# Patient Record
Sex: Male | Born: 1985 | Race: Black or African American | Hispanic: No | Marital: Single | Smoking: Current every day smoker
Health system: Southern US, Community
[De-identification: ages and names within clinical notes are randomized; demographics above are authoritative.]

## PROBLEM LIST (undated history)

## (undated) DIAGNOSIS — F319 Bipolar disorder, unspecified: Secondary | ICD-10-CM

---

## 2016-07-07 ENCOUNTER — Emergency Department (HOSPITAL_COMMUNITY)
Admission: EM | Admit: 2016-07-07 | Discharge: 2016-07-07 | Disposition: A | Discharge status: Home / Self Care | Payer: Medicare Other | Attending: Emergency Medicine | Admitting: Emergency Medicine

## 2016-07-07 ENCOUNTER — Encounter (HOSPITAL_COMMUNITY): Payer: Self-pay | Admitting: *Deleted

## 2016-07-07 DIAGNOSIS — K921 Melena: Secondary | ICD-10-CM | POA: Insufficient documentation

## 2016-07-07 DIAGNOSIS — R1033 Periumbilical pain: Secondary | ICD-10-CM | POA: Insufficient documentation

## 2016-07-07 DIAGNOSIS — F17210 Nicotine dependence, cigarettes, uncomplicated: Secondary | ICD-10-CM | POA: Insufficient documentation

## 2016-07-07 LAB — POC OCCULT BLOOD, ED: FECAL OCCULT BLD: NEGATIVE

## 2016-07-07 NOTE — ED Triage Notes (Signed)
Patient states yesterday he noticed mild abdominal pain in periumbilical area.  This morning, patient noticed "a knot" above his umbilicus.  Patient denies N/V/D and fever.  Patient has never been dx with a hernia and denies hx of abdominal surgeries.

## 2016-07-07 NOTE — Discharge Instructions (Signed)
Read the information below.   Your hemoccult was negative for blood. I suspect the mass in your abdomen may be a hernia or lymph node. You can take tylenol or motrin for pain relief. You can take a stool softner such as dulcolax or colace. Try to avoid straining or heavy lifting.  If symptoms do not improve, the contact information for surgery is provided. Please call to schedule a follow up.  You may return to the Emergency Department at any time for worsening condition or any new symptoms that concern you. Return to ED if you develop fever, nausea/vomiting, blood in stool, or inability to keep food down.

## 2016-07-07 NOTE — ED Provider Notes (Signed)
[facility]-EMERGENCY DEPT Provider Note   CSN: [id number] Arrival date & time: 07/07/16  1135     History   Chief Complaint Chief Complaint  Patient presents with  . Abdominal Pain    HPI Russell Boyle is a 30 y.o. male.  Russell Boyle is a 30 y.o. male with no pertinent medical history presents to ED with complaint of periumbilical pain and a "knot" in abdomen. Patient states he started experiencing periumbilical pain yesterday after drinking a soda. Pain is intermittent, 6-7/10, non-radiating, cramping in nature. Pain is worse with movement. Nothing seems to make the pain better. This morning he noticed a "knot" above his umbilicus. It is mildly tender with manipulation. He denies nausea, vomiting, diarrhea, or constipation. He does state that he has had intermittent "blood clots" in stool and bright red blood on tissue over the last 4-6 months. He denies fever, dysuria, hematuria, chest pain, shortness of breath. No history of hernia. No h/o abdominal conditions. No abdominal surgeries.       History reviewed. No pertinent past medical history.  There are no active problems to display for this patient.   History reviewed. No pertinent surgical history.     Home Medications    Prior to Admission medications   Not on File    Family History No family history on file.  Social History Social History  Substance Use Topics  . Smoking status: Current Every Day Smoker    Packs/day: 1.00    Types: Cigarettes  . Smokeless tobacco: Never Used  . Alcohol use No     Allergies   Risperdal [risperidone] and Geodon [ziprasidone hcl]   Review of Systems Review of Systems  Constitutional: Negative for chills, diaphoresis and fever.  HENT: Negative for trouble swallowing.   Eyes: Negative for visual disturbance.  Respiratory: Negative for shortness of breath.   Cardiovascular: Negative for chest pain.  Gastrointestinal: Positive for abdominal pain and blood in stool (  intermittent). Negative for constipation, diarrhea, nausea and vomiting.  Genitourinary: Negative for dysuria and hematuria.  Musculoskeletal: Negative for neck pain.  Skin: Negative for rash.  Neurological: Negative for headaches.     Physical Exam Updated Vital Signs BP 119/81 (BP Location: Right Arm)   Pulse 71   Temp 97.9 F (36.6 C) (Oral)   Resp 17   Ht 5\' 11"  (1.803 m)   Wt 98 kg   SpO2 100%   BMI 30.13 kg/m   Physical Exam  Constitutional: He appears well-developed and well-nourished. No distress.  HENT:  Head: Normocephalic and atraumatic.  Mouth/Throat: Oropharynx is clear and moist. No oropharyngeal exudate.  Eyes: Conjunctivae and EOM are normal. Pupils are equal, round, and reactive to light. Right eye exhibits no discharge. Left eye exhibits no discharge. No scleral icterus.  Neck: Normal range of motion. Neck supple.  Cardiovascular: Normal rate, regular rhythm, normal heart sounds and intact distal pulses.   No murmur heard. Pulmonary/Chest: Effort normal and breath sounds normal. No respiratory distress.  Abdominal: Soft. Bowel sounds are normal. He exhibits mass. There is tenderness in the periumbilical area. There is no rigidity, no rebound and no guarding.  Firm mass noted above umbilicus with TTP and manipulation. No overlying skin changes noted. No warmth.   Genitourinary:  Genitourinary Comments: Chaperone present for duration of exam. No external hemorrhoids or fissures noted. Rectal tone is normal. No tenderness. No masses palpated. Stool noted in rectal vault. Stool is brown in color.   Musculoskeletal: Normal range of motion.  Lymphadenopathy:    He has no cervical adenopathy.  Neurological: He is alert. Coordination normal.  Skin: Skin is warm and dry. He is not diaphoretic.  Psychiatric: He has a normal mood and affect. His behavior is normal.     ED Treatments / Results  Labs (all labs ordered are listed, but only abnormal results are  displayed) Labs Reviewed  POC OCCULT BLOOD, ED    EKG  EKG Interpretation None       Radiology No results found.  Procedures Procedures (including critical care time)  Medications Ordered in ED Medications - No data to display   Initial Impression / Assessment and Plan / ED Course  I have reviewed the triage vital signs and the nursing notes.  Pertinent labs & imaging results that were available during my care of the patient were reviewed by me and considered in my medical decision making (see chart for details).  Clinical Course    Patient is afebrile and non-toxic appearing in NAD. Vital signs are stable. Physical exam remarkable for mass above umbilicus with TTP and manipulation. No overlying skin changes. Abdomen is soft. Positive bowel sounds. No peritoneal signs. Bedside US performed by Dr. Silverio LayYao suggestive of ?hernia - no bowel noted vs. ?lymph node; however, no doppler flow noted. Hemoccult negative. Discussed symptomatic management to include avoiding straining, use of stool softeners, and pain control. Provided contact information for general surgery for further evaluation and management. Return precautions provided. Patient voiced understanding and is agreeable.   Final Clinical Impressions(s) / ED Diagnoses   Final diagnoses:  Periumbilical abdominal pain    New Prescriptions New Prescriptions   No medications on file     Lona Kettleshley Laurel Meyer, PA-C 07/07/16 1420    Charlynne Panderavid Hsienta Yao, MD 07/07/16 [phone]

## 2018-11-27 ENCOUNTER — Other Ambulatory Visit: Payer: Self-pay

## 2018-11-27 ENCOUNTER — Emergency Department
Admission: EM | Admit: 2018-11-27 | Discharge: 2018-11-27 | Disposition: A | Discharge status: Home / Self Care | Payer: Medicare Other | Attending: Emergency Medicine | Admitting: Emergency Medicine

## 2018-11-27 DIAGNOSIS — Y9389 Activity, other specified: Secondary | ICD-10-CM | POA: Insufficient documentation

## 2018-11-27 DIAGNOSIS — Z23 Encounter for immunization: Secondary | ICD-10-CM | POA: Insufficient documentation

## 2018-11-27 DIAGNOSIS — Y929 Unspecified place or not applicable: Secondary | ICD-10-CM | POA: Diagnosis not present

## 2018-11-27 DIAGNOSIS — S61211A Laceration without foreign body of left index finger without damage to nail, initial encounter: Secondary | ICD-10-CM | POA: Diagnosis not present

## 2018-11-27 DIAGNOSIS — W268XXA Contact with other sharp object(s), not elsewhere classified, initial encounter: Secondary | ICD-10-CM | POA: Diagnosis not present

## 2018-11-27 DIAGNOSIS — Y998 Other external cause status: Secondary | ICD-10-CM | POA: Insufficient documentation

## 2018-11-27 DIAGNOSIS — F17210 Nicotine dependence, cigarettes, uncomplicated: Secondary | ICD-10-CM | POA: Insufficient documentation

## 2018-11-27 DIAGNOSIS — S6992XA Unspecified injury of left wrist, hand and finger(s), initial encounter: Secondary | ICD-10-CM | POA: Diagnosis present

## 2018-11-27 HISTORY — DX: Bipolar disorder, unspecified: F31.9

## 2018-11-27 MED ORDER — CEPHALEXIN 500 MG PO CAPS: 500.0000 mg | ORAL_CAPSULE | Freq: Four times a day (QID) | ORAL | 0 refills | Status: AC

## 2018-11-27 MED ORDER — TETANUS-DIPHTH-ACELL PERTUSSIS 5-2.5-18.5 LF-MCG/0.5 IM SUSP
0.5000 mL | Freq: Once | INTRAMUSCULAR | Status: AC
  Administered 2018-11-27: 0.5 mL via INTRAMUSCULAR
  Filled 2018-11-27: qty 0.5

## 2018-11-27 MED ORDER — BACITRACIN-NEOMYCIN-POLYMYXIN 400-5-5000 EX OINT: 1.0000 "application " | TOPICAL_OINTMENT | Freq: Two times a day (BID) | CUTANEOUS | 0 refills | Status: DC

## 2018-11-27 NOTE — ED Notes (Addendum)
See triage note  Laceration noted to left index finger    States the razor slipped when cutting some caulking   This happened last pm around 8 pm

## 2018-11-27 NOTE — ED Provider Notes (Signed)
[facility] Emergency Department Provider Note  ____________________________________________  Time seen: Approximately 11:12 AM  I have reviewed the triage vital signs and the nursing notes.   HISTORY  Chief Complaint Laceration    HPI Russell Boyle is a 33 y.o. male presents emergency department for evaluation of left finger laceration that happened last night.  Patient states that he cut himself on a clean razor about 8 PM.  He has kept a bandage on area but had it has continued to bleed.  No known clotting disorders.   Past Medical History:  Diagnosis Date  . Bipolar 1 disorder (HCC)     There are no active problems to display for this patient.   History reviewed. No pertinent surgical history.  Prior to Admission medications   Medication Sig Start Date End Date Taking? Authorizing Provider  cephALEXin (KEFLEX) 500 MG capsule Take 1 capsule (500 mg total) by mouth 4 (four) times daily for 10 days. 11/27/18 12/07/18  Enid DerryWagner, Dowell Hoon, PA-C    Allergies Risperdal [risperidone] and Geodon [ziprasidone hcl]  No family history on file.  Social History Social History   Tobacco Use  . Smoking status: Current Every Day Smoker    Packs/day: 1.00    Types: Cigarettes  . Smokeless tobacco: Never Used  Substance Use Topics  . Alcohol use: No  . Drug use: No     Review of Systems  Constitutional: No fever/chills Cardiovascular: No chest pain. Respiratory: No SOB. Gastrointestinal: No abdominal pain.  No nausea, no vomiting.  Musculoskeletal: Positive for finger pain. Skin: Negative for rash, ecchymosis. Positive for laceration.   ____________________________________________   PHYSICAL EXAM:  VITAL SIGNS: ED Triage Vitals [11/27/18 0943]  Enc Vitals Group     BP (!) 139/100     Pulse Rate 79     Resp 17     Temp 98.5 F (36.9 C)     Temp Source Oral     SpO2 99 %     Weight 210 lb (95.3 kg)     Height 5\' 10"  (1.778 m)     Head  Circumference      Peak Flow      Pain Score 0     Pain Loc      Pain Edu?      Excl. in GC?      Constitutional: Alert and oriented. Well appearing and in no acute distress. Eyes: Conjunctivae are normal. PERRL. EOMI. Head: Atraumatic. ENT:      Ears:      Nose: No congestion/rhinnorhea.      Mouth/Throat: Mucous membranes are moist.  Neck: No stridor.   Cardiovascular: Normal rate, regular rhythm.  Good peripheral circulation. Respiratory: Normal respiratory effort without tachypnea or retractions. Lungs CTAB. Good air entry to the bases with no decreased or absent breath sounds. Musculoskeletal: Full range of motion to all extremities. No gross deformities appreciated. Neurologic:  Normal speech and language. No gross focal neurologic deficits are appreciated.  Skin:  Skin is warm, dry. 1/2 cm laceration to distal finger lateral to nail. Psychiatric: Mood and affect are normal. Speech and behavior are normal. Patient exhibits appropriate insight and judgement.   ____________________________________________   LABS (all labs ordered are listed, but only abnormal results are displayed)  Labs Reviewed - No data to display ____________________________________________  EKG   ____________________________________________  RADIOLOGY  No results found.  ____________________________________________    PROCEDURES  Procedure(s) performed:    Procedures  LACERATION REPAIR Performed by: Morrie SheldonAshley  Loreta Ave  Consent: Verbal consent obtained.  Consent given by: patient  Prepped and Draped in normal sterile fashion  Wound explored: No foreign bodies   Laceration Location: right index finger  Laceration Length: 1/2 cm  Anesthesia: None  Local anesthetic: none  Irrigation method: syringe  Amount of cleaning: normal saline  Skin closure: 4-0 nylon  Number of sutures: 2  Technique: Simple interrupted  Patient tolerance: Patient tolerated the procedure  well with no immediate complications.  Medications  Tdap (BOOSTRIX) injection 0.5 mL (0.5 mLs Intramuscular Given 11/27/18 1122)     ____________________________________________   INITIAL IMPRESSION / ASSESSMENT AND PLAN / ED COURSE  Pertinent labs & imaging results that were available during my care of the patient were reviewed by me and considered in my medical decision making (see chart for details).  Review of the [place] CSRS was performed in accordance of the [facility] prior to dispensing any controlled drugs.   Patient presented the emergency department for evaluation of finger laceration.  Vital signs and exam are reassuring.  Laceration was repaired with 2 stitches.  He received a prescription for Keflex since laceration is 15 hours low old.  Tetanus shot was updated.  Patient is to follow up with primary care as directed. Patient is given ED precautions to return to the ED for any worsening or new symptoms.     ____________________________________________  FINAL CLINICAL IMPRESSION(S) / ED DIAGNOSES  Final diagnoses:  Laceration of left index finger without foreign body without damage to nail, initial encounter      NEW MEDICATIONS STARTED DURING THIS VISIT:  ED Discharge Orders         Ordered    neomycin-bacitracin-polymyxin (NEOSPORIN) ointment  Every 12 hours,   Status:  Discontinued     11/27/18 1117    cephALEXin (KEFLEX) 500 MG capsule  4 times daily     11/27/18 1122              This chart was dictated using voice recognition software/Dragon. Despite best efforts to proofread, errors can occur which can change the meaning. Any change was purely unintentional.    Enid Derry, PA-C 11/27/18 1254    Emily Filbert, MD 11/27/18 1355

## 2018-11-27 NOTE — ED Notes (Signed)
Covered the wound with a standard band aid.

## 2018-11-27 NOTE — ED Triage Notes (Signed)
Pt reports he was opening caulk and cut left pointer finger with a razor - laceration is wrapped and bleeding controlled at this time

## 2019-02-18 ENCOUNTER — Other Ambulatory Visit: Payer: Self-pay

## 2019-02-18 ENCOUNTER — Emergency Department: Payer: Medicare Other

## 2019-02-18 ENCOUNTER — Emergency Department
Admission: EM | Admit: 2019-02-18 | Discharge: 2019-02-18 | Disposition: A | Discharge status: Home / Self Care | Payer: Medicare Other | Attending: Emergency Medicine | Admitting: Emergency Medicine

## 2019-02-18 DIAGNOSIS — R519 Headache, unspecified: Secondary | ICD-10-CM

## 2019-02-18 DIAGNOSIS — F17210 Nicotine dependence, cigarettes, uncomplicated: Secondary | ICD-10-CM | POA: Diagnosis not present

## 2019-02-18 DIAGNOSIS — R51 Headache: Secondary | ICD-10-CM | POA: Diagnosis not present

## 2019-02-18 DIAGNOSIS — R200 Anesthesia of skin: Secondary | ICD-10-CM | POA: Diagnosis present

## 2019-02-18 LAB — CBC
HCT: 47.4 % (ref 39.0–52.0)
Hemoglobin: 15.9 g/dL (ref 13.0–17.0)
MCH: 28.5 pg (ref 26.0–34.0)
MCHC: 33.5 g/dL (ref 30.0–36.0)
MCV: 84.9 fL (ref 80.0–100.0)
Platelets: 248 10*3/uL (ref 150–400)
RBC: 5.58 MIL/uL (ref 4.22–5.81)
RDW: 14.1 % (ref 11.5–15.5)
WBC: 8.3 10*3/uL (ref 4.0–10.5)
nRBC: 0 % (ref 0.0–0.2)

## 2019-02-18 LAB — BASIC METABOLIC PANEL
Anion gap: 8 (ref 5–15)
BUN: 16 mg/dL (ref 6–20)
CO2: 25 mmol/L (ref 22–32)
Calcium: 8.7 mg/dL — ABNORMAL LOW (ref 8.9–10.3)
Chloride: 106 mmol/L (ref 98–111)
Creatinine, Ser: 1.18 mg/dL (ref 0.61–1.24)
GFR calc Af Amer: >60 mL/min (ref >60)
GFR calc non Af Amer: >60 mL/min (ref >60)
Glucose, Bld: 127 mg/dL — ABNORMAL HIGH (ref 70–99)
Potassium: 3.4 mmol/L — ABNORMAL LOW (ref 3.5–5.1)
Sodium: 139 mmol/L (ref 135–145)

## 2019-02-18 MED ORDER — ACETAMINOPHEN 500 MG PO TABS
1000.0000 mg | ORAL_TABLET | Freq: Once | ORAL | Status: AC
  Administered 2019-02-18: 23:00:00 1000 mg via ORAL
  Filled 2019-02-18: qty 2

## 2019-02-18 MED ORDER — PROCHLORPERAZINE MALEATE 10 MG PO TABS
10.0000 mg | ORAL_TABLET | Freq: Once | ORAL | Status: AC
  Administered 2019-02-18: 23:00:00 10 mg via ORAL
  Filled 2019-02-18: qty 1

## 2019-02-18 MED ORDER — DIPHENHYDRAMINE HCL 25 MG PO CAPS
25.0000 mg | ORAL_CAPSULE | Freq: Once | ORAL | Status: AC
  Administered 2019-02-18: 23:00:00 25 mg via ORAL
  Filled 2019-02-18: qty 1

## 2019-02-18 NOTE — ED Provider Notes (Addendum)
[facility] Emergency Department Provider Note  ____________________________________________  Time seen: Approximately 10:23 PM  I have reviewed the triage vital signs and the nursing notes.   HISTORY  Chief Complaint Numbness    HPI Russell Boyle is a 33 y.o. male history of bipolar disorder and migraines presenting for headache.  The patient reports that 10 days ago he developed 5 days of severe cough, which have completely resolved.  Over the past 2 days, he has developed a severe headache and a sensation of numbness on the top of his scalp, without any numbness on the face or extremities.  He also describes that he has severe pain when he touches the scalp.  He has not had any trauma, nausea or vomiting, stiff neck or tick bite, fever.  He has taken Tylenol and the headache has resolved, but then it has come back.  Past Medical History:  Diagnosis Date  . Bipolar 1 disorder (HCC)     There are no active problems to display for this patient.   No past surgical history on file.    Allergies Risperdal [risperidone] and Geodon [ziprasidone hcl]  No family history on file.  Social History Social History   Tobacco Use  . Smoking status: Current Every Day Smoker    Packs/day: 1.00    Types: Cigarettes  . Smokeless tobacco: Never Used  Substance Use Topics  . Alcohol use: No  . Drug use: No    Review of Systems Constitutional: No fever/chills.  No lightheadedness or syncope.  No trauma. Eyes: No visual changes.  No blurred or double vision. ENT: No sore throat. No congestion or rhinorrhea. Cardiovascular: Denies chest pain. Denies palpitations. Respiratory: Denies shortness of breath.  5 days of cough, now completely resolved. Gastrointestinal: No abdominal pain.  No nausea, no vomiting.  No diarrhea.  No constipation. Genitourinary: Negative for dysuria. Musculoskeletal: Negative for back pain. Skin: Negative for rash. Neurological:  Positive for headache associated scalp numbness as well as scalp sensitivity.. No focal numbness, tingling or weakness.     ____________________________________________   PHYSICAL EXAM:  VITAL SIGNS: ED Triage Vitals  Enc Vitals Group     BP 02/18/19 2220 133/80     Pulse Rate 02/18/19 2220 77     Resp 02/18/19 2220 18     Temp 02/18/19 2220 98.2 F (36.8 C)     Temp Source 02/18/19 2220 Oral     SpO2 02/18/19 2220 97 %     Weight 02/18/19 2047 210 lb (95.3 kg)     Height 02/18/19 2047 5\' 11"  (1.803 m)     Head Circumference --      Peak Flow --      Pain Score 02/18/19 2047 0     Pain Loc --      Pain Edu? --      Excl. in GC? --     Constitutional: Alert and oriented.  Answers questions appropriately.  GCS is 15. Eyes: Conjunctivae are normal.  EOMI. PERRLA.  No scleral icterus. Head: Atraumatic. Nose: No congestion/rhinnorhea. Mouth/Throat: Mucous membranes are mildly dry.  Neck: No stridor.  Supple.  No JVD.  No meningismus. Cardiovascular: Normal rate, regular rhythm. No murmurs, rubs or gallops.  Respiratory: Normal respiratory effort.  No accessory muscle use or retractions. Lungs CTAB.  No wheezes, rales or ronchi. Gastrointestinal: Soft, nontender and nondistended.  No guarding or rebound.  No peritoneal signs. Musculoskeletal: No LE edema.  Neurologic:  A&Ox3.  Speech is clear.  Face and smile are symmetric.  EOMI. PERRLA.  No horizontal or vertical nystagmus.  5 out of 5 bilateral grip, biceps and triceps strength.  Normal gait without ataxia.  No evidence of phono or photosensitivity. Skin:  Skin is warm, dry and intact. No rash noted. Psychiatric: Mood and affect are normal. Speech and behavior are normal.  Normal judgement.  ____________________________________________   LABS (all labs ordered are listed, but only abnormal results are displayed)  Labs Reviewed  BASIC METABOLIC PANEL - Abnormal; Notable for the following components:      Result Value    Potassium 3.4 (*)    Glucose, Bld 127 (*)    Calcium 8.7 (*)    All other components within normal limits  CBC   ____________________________________________  EKG  ED ECG REPORT I, Anne-Caroline Sharma Covert, the attending physician, personally viewed and interpreted this ECG.   Date: 03/03/2019  EKG Time: 2235  Rate: 78  Rhythm: normal sinus rhythm  Axis: normal  Intervals:none  ST&T Change: No STEMI   ____________________________________________  RADIOLOGY  Ct Head Wo Contrast  Result Date: 02/18/2019 CLINICAL DATA:  33 year old male with numbness of the right hand and neurological deficit. EXAM: CT HEAD WITHOUT CONTRAST TECHNIQUE: Contiguous axial images were obtained from the base of the skull through the vertex without intravenous contrast. COMPARISON:  None. FINDINGS: Brain: No evidence of acute infarction, hemorrhage, hydrocephalus, extra-axial collection or mass lesion/mass effect. Vascular: No hyperdense vessel or unexpected calcification. Skull: Normal. Negative for fracture or focal lesion. Sinuses/Orbits: No acute finding. Other: None. IMPRESSION: Normal noncontrast CT of the brain. Electronically Signed   By: Elgie Collard M.D.   On: 02/18/2019 23:14    ____________________________________________   PROCEDURES  Procedure(s) performed: None  Procedures  Critical Care performed: No ____________________________________________   INITIAL IMPRESSION / ASSESSMENT AND PLAN / ED COURSE  Pertinent labs & imaging results that were available during my care of the patient were reviewed by me and considered in my medical decision making (see chart for details).  32 y.o. L, with a history of bipolar disorder migraine, presenting with headache, and scalp numbness and sensitivity.  Overall, the patient is well-appearing and hemodynamically stable.  He has no focal neurologic deficits on my examination.  I have talked to him about the possibilities of atypical migraine, or  dehydration given his dry mucous membranes.  Here we will provide him with water, and symptomatic treatment.  He will undergo CT examination and get basic laboratory studies.  Reevaluation for final disposition.  ----------------------------------------- 11:20 PM on 02/18/2019 -----------------------------------------  Patient CT is reassuring and his blood work does not show any significant causes for his headache.  At this time, plan to discharge the patient home.  I have encouraged him to establish a PMD for follow-up and as well for routine health maintenance.  Follow-up instructions as well as return precautions were discussed.  ____________________________________________  FINAL CLINICAL IMPRESSION(S) / ED DIAGNOSES  Final diagnoses:  Nonintractable headache, unspecified chronicity pattern, unspecified headache type  Scalp pain  Numbness         NEW MEDICATIONS STARTED DURING THIS VISIT:  New Prescriptions   No medications on file      Rockne Menghini, MD 02/18/19 9935    Rockne Menghini, MD 03/03/19 [phone]

## 2019-02-18 NOTE — Discharge Instructions (Addendum)
Drink plenty of fluid to stay well-hydrated.  You may take Tylenol or Motrin for your headache.  Make a follow-up appointment with a primary care physician, who can also do your routine health maintenance.  Return to the emergency department if you develop severe pain, lightheadedness or fainting, inability to keep down fluids, fever, or any other symptoms concerning to you.

## 2019-02-18 NOTE — ED Triage Notes (Signed)
Pt in with co numbness to right side of head, for 2 days. Also co headache.

## 2019-09-06 ENCOUNTER — Other Ambulatory Visit: Payer: Self-pay

## 2019-09-06 ENCOUNTER — Emergency Department
Admission: EM | Admit: 2019-09-06 | Discharge: 2019-09-06 | Disposition: A | Discharge status: Home / Self Care | Payer: Medicare Other | Attending: Emergency Medicine | Admitting: Emergency Medicine

## 2019-09-06 DIAGNOSIS — F17210 Nicotine dependence, cigarettes, uncomplicated: Secondary | ICD-10-CM | POA: Diagnosis not present

## 2019-09-06 DIAGNOSIS — M62838 Other muscle spasm: Secondary | ICD-10-CM | POA: Diagnosis not present

## 2019-09-06 DIAGNOSIS — M25511 Pain in right shoulder: Secondary | ICD-10-CM | POA: Diagnosis present

## 2019-09-06 MED ORDER — CYCLOBENZAPRINE HCL 5 MG PO TABS: ORAL_TABLET | ORAL | 0 refills | Status: AC

## 2019-09-06 MED ORDER — HYDROCODONE-ACETAMINOPHEN 5-325 MG PO TABS: 1.0000 | ORAL_TABLET | Freq: Four times a day (QID) | ORAL | 0 refills | Status: AC | PRN

## 2019-09-06 MED ORDER — IBUPROFEN 800 MG PO TABS: 800.0000 mg | ORAL_TABLET | Freq: Three times a day (TID) | ORAL | 0 refills | Status: AC | PRN

## 2019-09-06 MED ORDER — KETOROLAC TROMETHAMINE 30 MG/ML IJ SOLN
30.0000 mg | Freq: Once | INTRAMUSCULAR | Status: AC
  Administered 2019-09-06: 30 mg via INTRAMUSCULAR
  Filled 2019-09-06: qty 1

## 2019-09-06 NOTE — ED Provider Notes (Signed)
[facility] Emergency Department Provider Note   ____________________________________________   First MD Initiated Contact with Patient 09/06/19 0254     (approximate)  I have reviewed the triage vital signs and the nursing notes.   HISTORY  Chief Complaint Shoulder Pain    HPI Russell Boyle is a 33 y.o. male who presents to the ED from home with a chief complaint of nontraumatic right shoulder pain.  Patient noted pain to his right posterior neck and shoulder approximately 3 to 4 days ago.  Denies fall/trauma/injury.  Does lift heavy objects at work.  Has been using heat with partial relief of symptoms.  Denies neck pain, extremity weakness/numbness/tingling.  Patient is right-hand dominant.       Past Medical History:  Diagnosis Date  . Bipolar 1 disorder (HCC)     There are no active problems to display for this patient.   No past surgical history on file.  Prior to Admission medications   Not on File    Allergies Risperdal [risperidone] and Geodon [ziprasidone hcl]  No family history on file.  Social History Social History   Tobacco Use  . Smoking status: Current Every Day Smoker    Packs/day: 1.00    Types: Cigarettes  . Smokeless tobacco: Never Used  Substance Use Topics  . Alcohol use: No  . Drug use: No    Review of Systems  Constitutional: No fever/chills Eyes: No visual changes. ENT: No sore throat. Cardiovascular: Denies chest pain. Respiratory: Denies shortness of breath. Gastrointestinal: No abdominal pain.  No nausea, no vomiting.  No diarrhea.  No constipation. Genitourinary: Negative for dysuria. Musculoskeletal: Positive for right shoulder pain.  Negative for back pain. Skin: Negative for rash. Neurological: Negative for headaches, focal weakness or numbness.   ____________________________________________   PHYSICAL EXAM:  VITAL SIGNS: ED Triage Vitals [09/06/19 0247]  Enc Vitals Group     BP      Pulse      Resp      Temp      Temp src      SpO2      Weight 212 lb (96.2 kg)     Height 5\' 10"  (1.778 m)     Head Circumference      Peak Flow      Pain Score 7     Pain Loc      Pain Edu?      Excl. in GC?     Constitutional: Alert and oriented. Well appearing and in no acute distress. Eyes: Conjunctivae are normal. PERRL. EOMI. Head: Atraumatic. Nose: No congestion/rhinnorhea. Mouth/Throat: Mucous membranes are moist.  Oropharynx non-erythematous. Neck: No stridor.  No cervical spine tenderness to palpation.  Right trapezius muscle spasms. Cardiovascular: Normal rate, regular rhythm. Grossly normal heart sounds.  Good peripheral circulation. Respiratory: Normal respiratory effort.  No retractions. Lungs CTAB. Gastrointestinal: Soft and nontender. No distention. No abdominal bruits. No CVA tenderness. Musculoskeletal:  Right posterior shoulder mildly tender to palpation and with full range of motion.  2+ radial pulse.  Brisk, less than 5-second capillary refill. No lower extremity tenderness nor edema.  No joint effusions. Neurologic:  Normal speech and language. No gross focal neurologic deficits are appreciated. No gait instability. Skin:  Skin is warm, dry and intact. No rash noted. Psychiatric: Mood and affect are normal. Speech and behavior are normal.  ____________________________________________   LABS (all labs ordered are listed, but only abnormal results are displayed)  Labs Reviewed - No data to  display ____________________________________________  EKG  None ____________________________________________  RADIOLOGY  ED MD interpretation: None  Official radiology report(s): No results found.  ____________________________________________   PROCEDURES  Procedure(s) performed (including Critical Care):  Procedures   ____________________________________________   INITIAL IMPRESSION / ASSESSMENT AND PLAN / ED COURSE  As part of my medical  decision making, I reviewed the following data within the [place] notes reviewed and incorporated, Notes from prior ED visits and [place] Controlled Substance Database     Russell Boyle was evaluated in Emergency Department on 09/06/2019 for the symptoms described in the history of present illness. He was evaluated in the context of the global COVID-19 pandemic, which necessitated consideration that the patient might be at risk for infection with the SARS-CoV-2 virus that causes COVID-19. Institutional protocols and algorithms that pertain to the evaluation of patients at risk for COVID-19 are in a state of rapid change based on information released by regulatory bodies including the CDC and federal and state organizations. These policies and algorithms were followed during the patient's care in the ED.    33 year old male with right shoulder pain secondary to trapezius muscle spasms.  Will treat with NSAIDs, analgesia, muscle relaxer and patient will follow-up with orthopedics as needed.  Strict return precautions given.  Patient verbalizes understanding agrees with plan of care.      ____________________________________________   FINAL CLINICAL IMPRESSION(S) / ED DIAGNOSES  Final diagnoses:  Acute pain of right shoulder  Muscle spasm     ED Discharge Orders    None       Note:  This document was prepared using Dragon voice recognition software and may include unintentional dictation errors.   Paulette Blanch, MD 09/06/19 [phone]

## 2019-09-06 NOTE — Discharge Instructions (Signed)
1.  You may take medicines as needed for pain and muscle spasms (Motrin/Norco/Flexeril #15). 2.  Continue moist heat to affected area several times daily. 3.  Return to the ER for worsening symptoms, persistent vomiting, difficulty breathing or other concerns.

## 2019-09-06 NOTE — ED Triage Notes (Signed)
Patient reports right shoulder pain (across top) and up into right side of neck for 3-4 days.  Reports worse on Saturday.  Denies any type of injury.

## 2020-03-02 IMAGING — CT CT HEAD WITHOUT CONTRAST
3 series · 15 of 47 positions shown, 18 images · non-contrast
Comparison: None.

CLINICAL DATA: 32-year-old male with numbness of the right hand and
neurological deficit.

EXAM:
CT HEAD WITHOUT CONTRAST
TECHNIQUE: Contiguous axial images were obtained from the base of the skull
through the vertex without intravenous contrast.

[Series 2: head wo · axial · 0.44mm/px · z∈[-118,+7]mm · 9 of 30 slices shown, 12 images]
[im 3/30  brain]
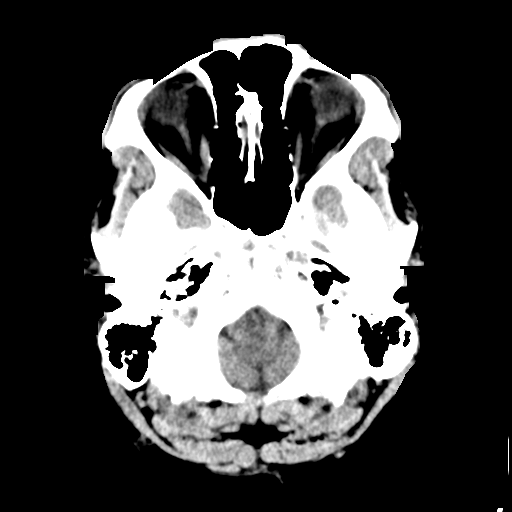
[im 3/30  bone]
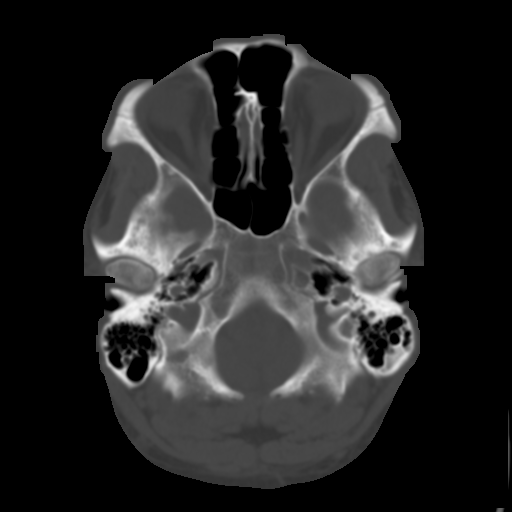
[im 6/30  brain]
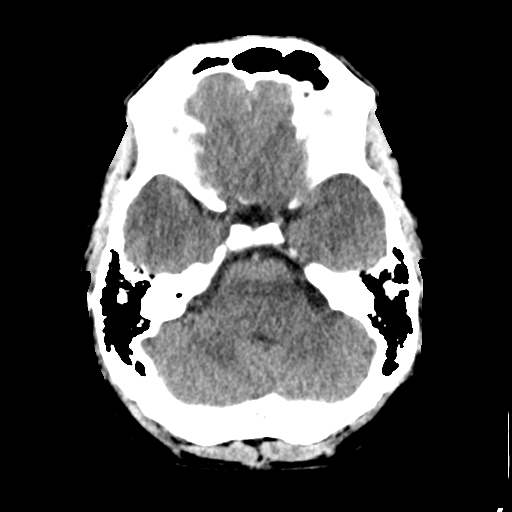
[im 9/30  brain]
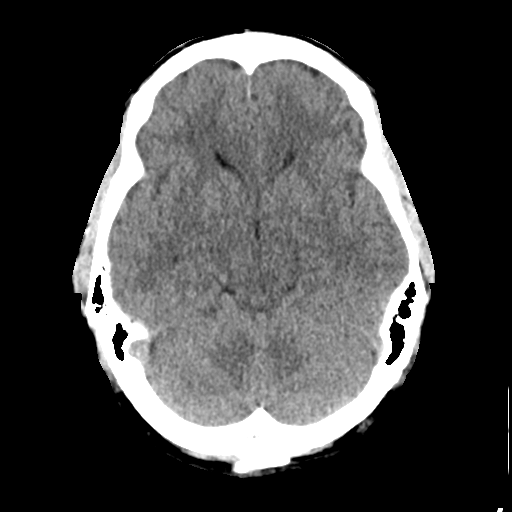
[im 12/30  brain]
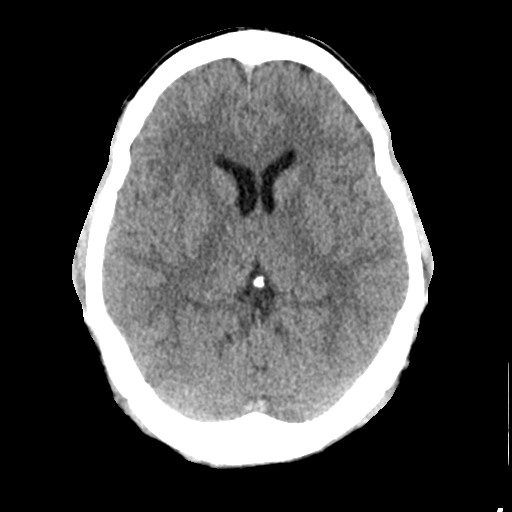
[im 16/30  brain]
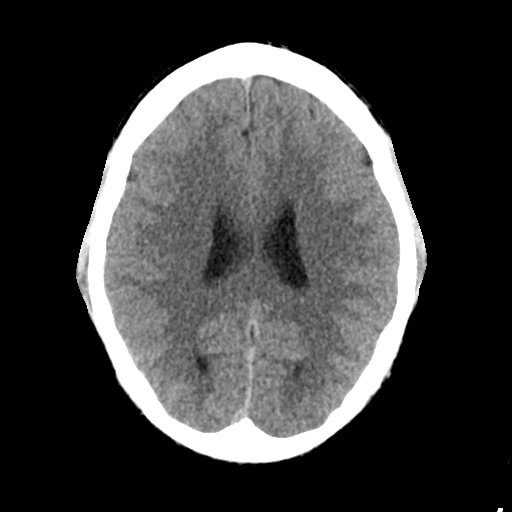
[im 16/30  bone]
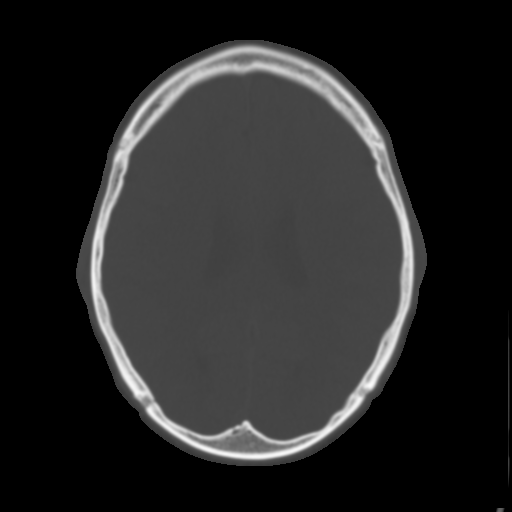
[im 19/30  brain]
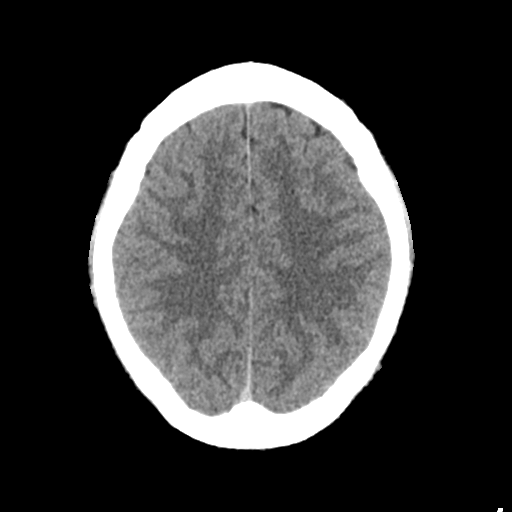
[im 22/30  brain]
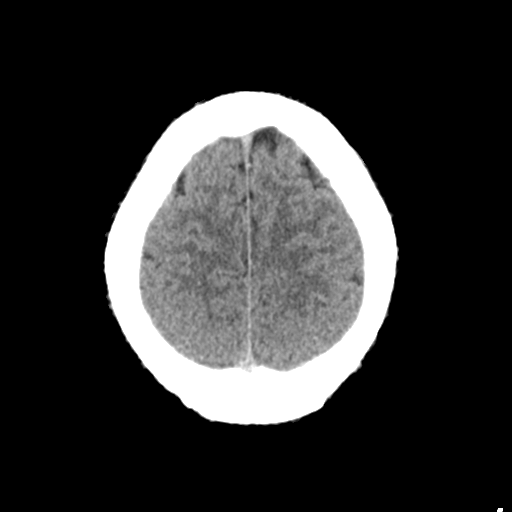
[im 25/30  brain]
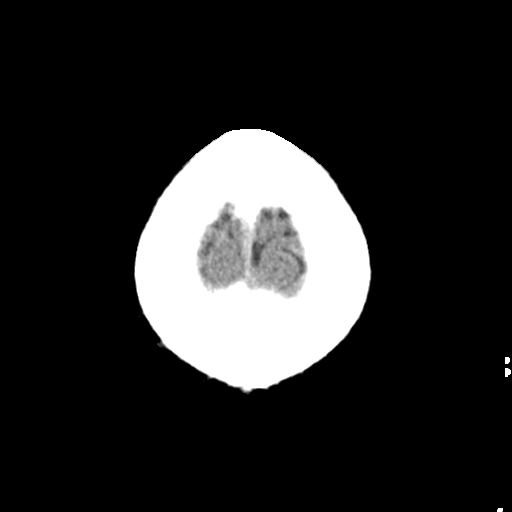
[im 28/30  brain]
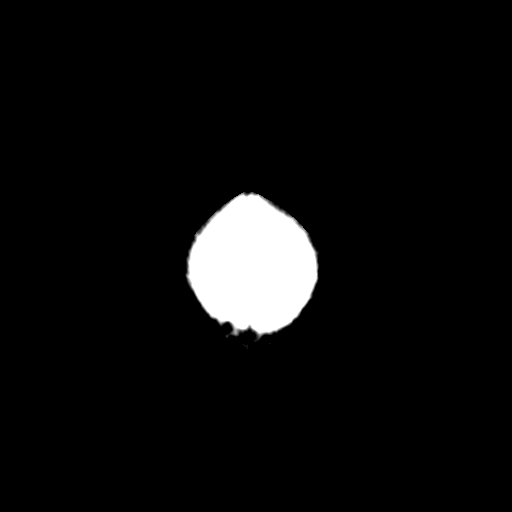
[im 28/30  bone]
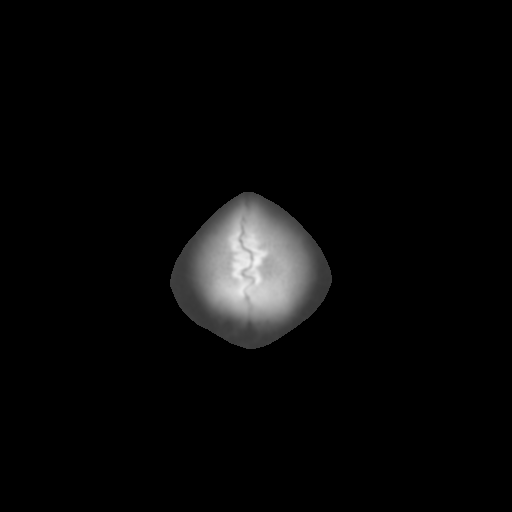

[Series 4: coronal soft tissue · coronal · 0.29mm/px · 3 of 66 slices shown]
[im 22/66  brain]
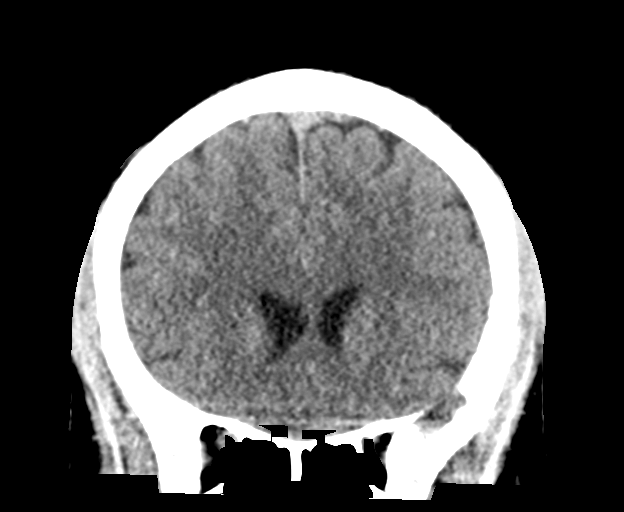
[im 29/66  brain]
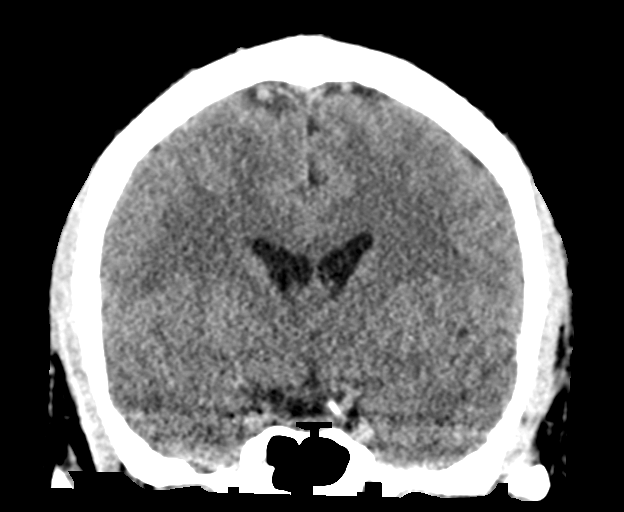
[im 37/66  brain]
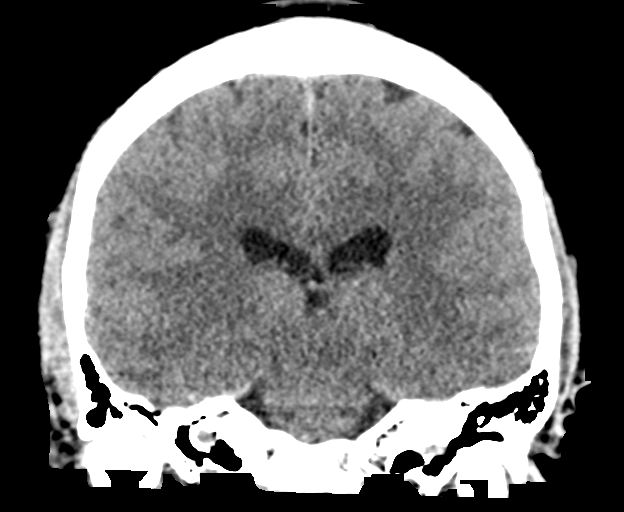

[Series 5: sagittal soft tissue · sagittal · 0.31mm/px · 3 of 57 slices shown]
[im 19/57  brain]
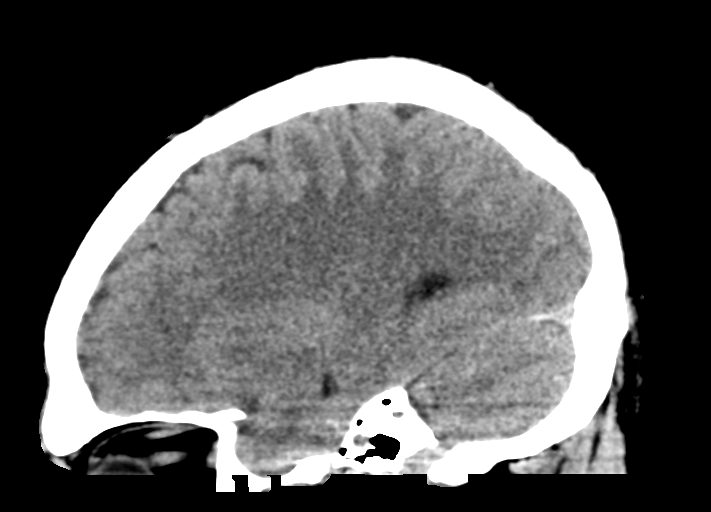
[im 29/57  brain]
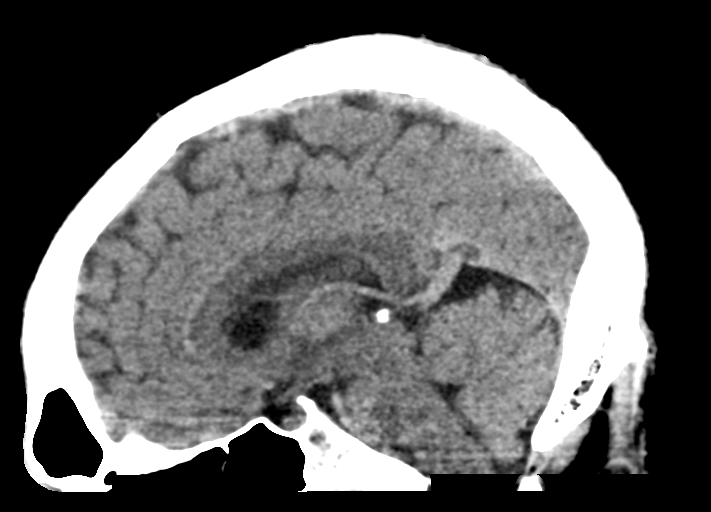
[im 38/57  brain]
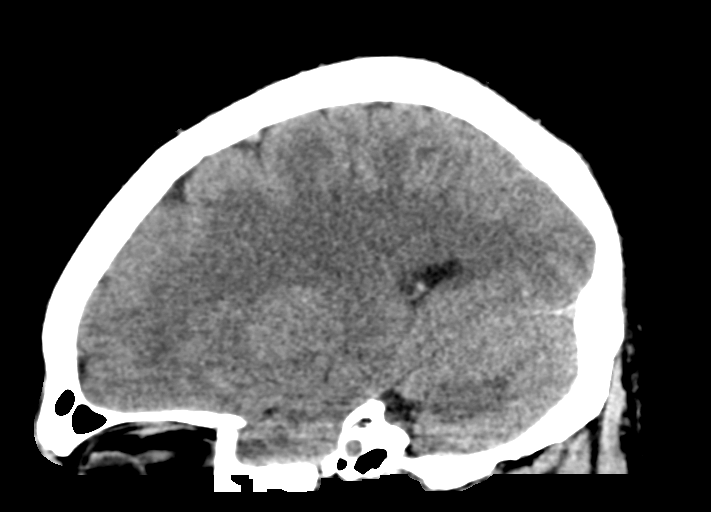

[15 of 47 positions shown; findings below may reference images not displayed]

FINDINGS: Brain: No evidence of acute infarction, hemorrhage, hydrocephalus,
extra-axial collection or mass lesion/mass effect.

Vascular: No hyperdense vessel or unexpected calcification.

Skull: Normal. Negative for fracture or focal lesion.

Sinuses/Orbits: No acute finding.

Other: None.
IMPRESSION: Normal noncontrast CT of the brain.

## 2024-03-20 ENCOUNTER — Other Ambulatory Visit: Payer: Self-pay | Admitting: Medical Genetics

## 2024-03-21 ENCOUNTER — Other Ambulatory Visit
Admission: RE | Admit: 2024-03-21 | Discharge: 2024-03-21 | Disposition: A | Discharge status: Home / Self Care | Payer: Self-pay | Source: Ambulatory Visit | Attending: Medical Genetics | Admitting: Medical Genetics

## 2024-04-03 LAB — GENECONNECT MOLECULAR SCREEN: Genetic Analysis Overall Interpretation: NEGATIVE
